# Patient Record
Sex: Male | Born: 1990 | Race: White | Hispanic: Yes | Marital: Married | Smoking: Current some day smoker
Health system: Southern US, Community
[De-identification: ages and names within clinical notes are randomized; demographics above are authoritative.]

---
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
## 2008-11-02 ENCOUNTER — Ambulatory Visit: Payer: Self-pay | Admitting: Internal Medicine

## 2008-11-14 ENCOUNTER — Ambulatory Visit: Payer: Self-pay | Admitting: *Deleted

## 2008-11-21 ENCOUNTER — Emergency Department (HOSPITAL_COMMUNITY): Admission: EM | Admit: 2008-11-21 | Discharge: 2008-11-21 | Payer: Self-pay | Admitting: Emergency Medicine

## 2008-11-30 ENCOUNTER — Ambulatory Visit: Payer: Self-pay | Admitting: Internal Medicine

## 2008-12-19 ENCOUNTER — Ambulatory Visit: Payer: Self-pay | Admitting: Internal Medicine

## 2009-03-15 ENCOUNTER — Ambulatory Visit (HOSPITAL_COMMUNITY): Admission: RE | Admit: 2009-03-15 | Discharge: 2009-03-15 | Payer: Self-pay | Admitting: Gastroenterology

## 2009-03-29 ENCOUNTER — Ambulatory Visit: Payer: Self-pay | Admitting: Internal Medicine

## 2009-04-09 ENCOUNTER — Ambulatory Visit (HOSPITAL_COMMUNITY): Admission: RE | Admit: 2009-04-09 | Discharge: 2009-04-09 | Payer: Self-pay | Admitting: Family Medicine

## 2009-05-15 ENCOUNTER — Ambulatory Visit: Payer: Self-pay | Admitting: Internal Medicine

## 2009-05-15 ENCOUNTER — Encounter (INDEPENDENT_AMBULATORY_CARE_PROVIDER_SITE_OTHER): Payer: Self-pay | Admitting: Adult Health

## 2009-05-15 LAB — CONVERTED CEMR LAB
Chlamydia, Swab/Urine, PCR: NEGATIVE
GC Probe Amp, Urine: NEGATIVE

## 2009-09-17 ENCOUNTER — Emergency Department (HOSPITAL_COMMUNITY): Admission: EM | Admit: 2009-09-17 | Discharge: 2009-09-17 | Payer: Self-pay | Admitting: Emergency Medicine

## 2010-08-13 LAB — CLOTEST (H. PYLORI), BIOPSY: Helicobacter screen: NEGATIVE

## 2010-08-18 LAB — COMPREHENSIVE METABOLIC PANEL
ALT: 9 U/L (ref 0–53)
AST: 19 U/L (ref 0–37)
Albumin: 4.3 g/dL (ref 3.5–5.2)
Alkaline Phosphatase: 67 U/L (ref 39–117)
BUN: 12 mg/dL (ref 6–23)
CO2: 24 mEq/L (ref 19–32)
Calcium: 9.5 mg/dL (ref 8.4–10.5)
Chloride: 105 mEq/L (ref 96–112)
Creatinine, Ser: 0.79 mg/dL (ref 0.4–1.5)
GFR calc Af Amer: >60 mL/min (ref >60)
GFR calc non Af Amer: >60 mL/min (ref >60)
Glucose, Bld: 96 mg/dL (ref 70–99)
Potassium: 4.1 mEq/L (ref 3.5–5.1)
Sodium: 137 mEq/L (ref 135–145)
Total Bilirubin: 1.3 mg/dL — ABNORMAL HIGH (ref 0.3–1.2)
Total Protein: 7 g/dL (ref 6.0–8.3)

## 2010-08-18 LAB — CBC
HCT: 37.8 % — ABNORMAL LOW (ref 39.0–52.0)
Hemoglobin: 13 g/dL (ref 13.0–17.0)
MCHC: 34.4 g/dL (ref 30.0–36.0)
MCV: 90.1 fL (ref 78.0–100.0)
Platelets: 271 10*3/uL (ref 150–400)
RBC: 4.19 MIL/uL — ABNORMAL LOW (ref 4.22–5.81)
RDW: 12.7 % (ref 11.5–15.5)
WBC: 6.5 10*3/uL (ref 4.0–10.5)

## 2010-08-18 LAB — URINALYSIS, ROUTINE W REFLEX MICROSCOPIC
Bilirubin Urine: NEGATIVE
Glucose, UA: NEGATIVE mg/dL
Hgb urine dipstick: NEGATIVE
Ketones, ur: NEGATIVE mg/dL
Nitrite: NEGATIVE
Protein, ur: NEGATIVE mg/dL
Specific Gravity, Urine: 1.028 (ref 1.005–1.030)
Urobilinogen, UA: 1 mg/dL (ref 0.0–1.0)
pH: 6 (ref 5.0–8.0)

## 2010-08-18 LAB — DIFFERENTIAL
Basophils Absolute: 0 10*3/uL (ref 0.0–0.1)
Basophils Relative: 1 % (ref 0–1)
Eosinophils Absolute: 0.1 10*3/uL (ref 0.0–0.7)
Eosinophils Relative: 1 % (ref 0–5)
Lymphocytes Relative: 34 % (ref 12–46)
Lymphs Abs: 2.2 10*3/uL (ref 0.7–4.0)
Monocytes Absolute: 0.5 10*3/uL (ref 0.1–1.0)
Monocytes Relative: 8 % (ref 3–12)
Neutro Abs: 3.6 10*3/uL (ref 1.7–7.7)
Neutrophils Relative %: 56 % (ref 43–77)

## 2010-11-17 ENCOUNTER — Emergency Department (HOSPITAL_COMMUNITY): Payer: Self-pay

## 2010-11-17 ENCOUNTER — Emergency Department (HOSPITAL_COMMUNITY)
Admission: EM | Admit: 2010-11-17 | Discharge: 2010-11-17 | Disposition: A | Discharge status: Home / Self Care | Payer: Self-pay | Attending: Emergency Medicine | Admitting: Emergency Medicine

## 2010-11-17 DIAGNOSIS — W19XXXA Unspecified fall, initial encounter: Secondary | ICD-10-CM | POA: Insufficient documentation

## 2010-11-17 DIAGNOSIS — M25539 Pain in unspecified wrist: Secondary | ICD-10-CM | POA: Insufficient documentation

## 2010-11-17 DIAGNOSIS — S63509A Unspecified sprain of unspecified wrist, initial encounter: Secondary | ICD-10-CM | POA: Insufficient documentation

## 2016-05-25 ENCOUNTER — Ambulatory Visit (INDEPENDENT_AMBULATORY_CARE_PROVIDER_SITE_OTHER): Payer: BLUE CROSS/BLUE SHIELD | Admitting: Emergency Medicine

## 2016-05-25 VITALS — BP 98/70 | HR 103 | Temp 98.6°F | Resp 18 | Ht 64.0 in | Wt 122.0 lb

## 2016-05-25 DIAGNOSIS — M79605 Pain in left leg: Secondary | ICD-10-CM | POA: Insufficient documentation

## 2016-05-25 DIAGNOSIS — F411 Generalized anxiety disorder: Secondary | ICD-10-CM | POA: Diagnosis not present

## 2016-05-25 MED ORDER — FLUOXETINE HCL 20 MG PO TABS: 20.0000 mg | ORAL_TABLET | Freq: Every day | ORAL | 3 refills | Status: DC

## 2016-05-25 NOTE — Patient Instructions (Addendum)
   IF you received an x-ray today, you will receive an invoice from [facility]. Please contact [facility] at [phone] with questions or concerns regarding your invoice.   IF you received labwork today, you will receive an invoice from [facility]. Please contact [facility] at [phone] with questions or concerns regarding your invoice.   Our billing staff will not be able to assist you with questions regarding bills from these companies.  You will be contacted with the lab results as soon as they are available. The fastest way to get your results is to activate your My Chart account. Instructions are located on the last page of this paperwork. If you have not heard from us regarding the results in 2 weeks, please contact this office.     Generalized Anxiety Disorder Generalized anxiety disorder (GAD) is a mental disorder. It interferes with life functions, including relationships, work, and school. GAD is different from normal anxiety, which everyone experiences at some point in their lives in response to specific life events and activities. Normal anxiety actually helps us prepare for and get through these life events and activities. Normal anxiety goes away after the event or activity is over.  GAD causes anxiety that is not necessarily related to specific events or activities. It also causes excess anxiety in proportion to specific events or activities. The anxiety associated with GAD is also difficult to control. GAD can vary from mild to severe. People with severe GAD can have intense waves of anxiety with physical symptoms (panic attacks).  SYMPTOMS The anxiety and worry associated with GAD are difficult to control. This anxiety and worry are related to many life events and activities and also occur more days than not for 6 months or longer. People with GAD also have three or more of the following symptoms (one or more in children):  Restlessness.    Fatigue.  Difficulty concentrating.   Irritability.  Muscle tension.  Difficulty sleeping or unsatisfying sleep. DIAGNOSIS GAD is diagnosed through an assessment by your health care provider. Your health care provider will ask you questions aboutyour mood,physical symptoms, and events in your life. Your health care provider may ask you about your medical history and use of alcohol or drugs, including prescription medicines. Your health care provider may also do a physical exam and blood tests. Certain medical conditions and the use of certain substances can cause symptoms similar to those associated with GAD. Your health care provider may refer you to a mental health specialist for further evaluation. TREATMENT The following therapies are usually used to treat GAD:   Medication. Antidepressant medication usually is prescribed for long-term daily control. Antianxiety medicines may be added in severe cases, especially when panic attacks occur.   Talk therapy (psychotherapy). Certain types of talk therapy can be helpful in treating GAD by providing support, education, and guidance. A form of talk therapy called cognitive behavioral therapy can teach you healthy ways to think about and react to daily life events and activities.  Stress managementtechniques. These include yoga, meditation, and exercise and can be very helpful when they are practiced regularly. A mental health specialist can help determine which treatment is best for you. Some people see improvement with one therapy. However, other people require a combination of therapies. This information is not intended to replace advice given to you by your health care provider. Make sure you discuss any questions you have with your health care provider. Document Released: 08/22/2012 Document Revised: 05/18/2014 Document Reviewed: 08/22/2012 Elsevier   Interactive Patient Education  2017 Elsevier Inc.  

## 2016-05-25 NOTE — Progress Notes (Signed)
Joshua MeadowFrancis S Morris 25 y.o.   Chief Complaint  Patient presents with  . Leg Pain    L SIDE  . Stress  . Anxiety    HISTORY OF PRESENT ILLNESS: This is a 26 y.o. male complaining of chronic left leg pain x at least 4-5 years; no new trauma or symptoms; also c/o general anxiety/stress interfering with his normal regular activities.  HPI   Prior to Admission medications   Medication Sig Start Date End Date Taking? Authorizing Provider  FLUoxetine (PROZAC) 20 MG tablet Take 1 tablet (20 mg total) by mouth daily. 05/25/16   Dakiyah Heinke Victorino DecemberJose Ardella Chhim, MD    No Known Allergies  There are no active problems to display for this patient.   History reviewed. No pertinent past medical history.  History reviewed. No pertinent surgical history.  Social History   Social History  . Marital status: Single    Spouse name: N/A  . Number of children: N/A  . Years of education: N/A   Occupational History  . Not on file.   Social History Main Topics  . Smoking status: Never Smoker  . Smokeless tobacco: Never Used  . Alcohol use Not on file  . Drug use: Unknown  . Sexual activity: Not on file   Other Topics Concern  . Not on file   Social History Narrative  . No narrative on file    History reviewed. No pertinent family history.   Review of Systems  Constitutional: Negative for chills, fever, malaise/fatigue and weight loss.  HENT: Negative for congestion, ear pain, hearing loss, nosebleeds and sore throat.   Eyes: Negative for blurred vision, double vision and pain.  Respiratory: Negative for cough, hemoptysis, shortness of breath and wheezing.   Cardiovascular: Negative for chest pain, palpitations and leg swelling.  Gastrointestinal: Negative for abdominal pain, diarrhea, melena, nausea and vomiting.  Genitourinary: Negative for dysuria, flank pain and hematuria.  Musculoskeletal: Negative for back pain, myalgias and neck pain.       Left leg pain/cramps.  Skin: Negative for  rash.  Neurological: Negative for dizziness, tingling, sensory change, focal weakness and headaches.  Endo/Heme/Allergies: Does not bruise/bleed easily.  Psychiatric/Behavioral: Negative for depression, hallucinations, memory loss, substance abuse and suicidal ideas. The patient is nervous/anxious and has insomnia.   All other systems reviewed and are negative.  Vitals:   05/25/16 1425  BP: 98/70  Pulse: (!) 103  Resp: 18  Temp: 98.6 F (37 C)    Physical Exam  Constitutional: He is oriented to person, place, and time. He appears well-developed and well-nourished.  HENT:  Head: Normocephalic and atraumatic.  Right Ear: External ear normal.  Left Ear: External ear normal.  Nose: Nose normal.  Mouth/Throat: Oropharynx is clear and moist.  Eyes: Conjunctivae and EOM are normal. Pupils are equal, round, and reactive to light.  Neck: Normal range of motion. Neck supple. No thyromegaly present.  Cardiovascular: Normal rate, regular rhythm, normal heart sounds and intact distal pulses.   Pulmonary/Chest: Effort normal and breath sounds normal. No respiratory distress. He has no wheezes. He has no rales.  Abdominal: Soft. Bowel sounds are normal. He exhibits no distension. There is no tenderness.  Musculoskeletal: Normal range of motion.  LE : NVI, FROM, WNL  Lymphadenopathy:    He has no cervical adenopathy.  Neurological: He is alert and oriented to person, place, and time. He displays normal reflexes. No sensory deficit. He exhibits normal muscle tone. Coordination normal.  Skin: Skin is warm and  dry.  Psychiatric: He has a normal mood and affect. His behavior is normal.  Nursing note and vitals reviewed.    ASSESSMENT & PLAN: Joshua Morris was seen today for leg pain, stress and anxiety.  Diagnoses and all orders for this visit:  GAD (generalized anxiety disorder)  Left leg pain  Other orders -     FLUoxetine (PROZAC) 20 MG tablet; Take 1 tablet (20 mg total) by mouth  daily.  chronic left leg pain  Patient Instructions       IF you received an x-ray today, you will receive an invoice from [facility]. Please contact [facility] at [phone] with questions or concerns regarding your invoice.   IF you received labwork today, you will receive an invoice from [facility]. Please contact [facility] at [phone] with questions or concerns regarding your invoice.   Our billing staff will not be able to assist you with questions regarding bills from these companies.  You will be contacted with the lab results as soon as they are available. The fastest way to get your results is to activate your My Chart account. Instructions are located on the last page of this paperwork. If you have not heard from [place] regarding the results in 2 weeks, please contact this office.     Generalized Anxiety Disorder Generalized anxiety disorder (GAD) is a mental disorder. It interferes with life functions, including relationships, work, and school. GAD is different from normal anxiety, which everyone experiences at some point in their lives in response to specific life events and activities. Normal anxiety actually helps [place] prepare for and get through these life events and activities. Normal anxiety goes away after the event or activity is over.  GAD causes anxiety that is not necessarily related to specific events or activities. It also causes excess anxiety in proportion to specific events or activities. The anxiety associated with GAD is also difficult to control. GAD can vary from mild to severe. People with severe GAD can have intense waves of anxiety with physical symptoms (panic attacks).  SYMPTOMS The anxiety and worry associated with GAD are difficult to control. This anxiety and worry are related to many life events and activities and also occur more days than not for 6 months or longer. People with GAD also have three or more of the following symptoms  (one or more in children):  Restlessness.   Fatigue.  Difficulty concentrating.   Irritability.  Muscle tension.  Difficulty sleeping or unsatisfying sleep. DIAGNOSIS GAD is diagnosed through an assessment by your health care provider. Your health care provider will ask you questions aboutyour mood,physical symptoms, and events in your life. Your health care provider may ask you about your medical history and use of alcohol or drugs, including prescription medicines. Your health care provider may also do a physical exam and blood tests. Certain medical conditions and the use of certain substances can cause symptoms similar to those associated with GAD. Your health care provider may refer you to a mental health specialist for further evaluation. TREATMENT The following therapies are usually used to treat GAD:   Medication. Antidepressant medication usually is prescribed for long-term daily control. Antianxiety medicines may be added in severe cases, especially when panic attacks occur.   Talk therapy (psychotherapy). Certain types of talk therapy can be helpful in treating GAD by providing support, education, and guidance. A form of talk therapy called cognitive behavioral therapy can teach you healthy ways to think about and react to daily life events and  activities.  Stress managementtechniques. These include yoga, meditation, and exercise and can be very helpful when they are practiced regularly. A mental health specialist can help determine which treatment is best for you. Some people see improvement with one therapy. However, other people require a combination of therapies. This information is not intended to replace advice given to you by your health care provider. Make sure you discuss any questions you have with your health care provider. Document Released: 08/22/2012 Document Revised: 05/18/2014 Document Reviewed: 08/22/2012 Elsevier Interactive Patient Education  2017  Elsevier Inc.      Edwina Barth, MD [facility]

## 2016-07-29 ENCOUNTER — Telehealth: Payer: Self-pay | Admitting: Emergency Medicine

## 2016-07-29 MED ORDER — FLUOXETINE HCL 20 MG PO TABS: 20.0000 mg | ORAL_TABLET | Freq: Every day | ORAL | 0 refills | Status: DC

## 2016-07-29 NOTE — Telephone Encounter (Signed)
Pt is needing a refill on floxatine and the new insurance is requiring a 90 day supply   Best number to pharmacy is [phone]  Fax [phone]

## 2016-07-31 ENCOUNTER — Other Ambulatory Visit: Payer: Self-pay

## 2016-07-31 MED ORDER — FLUOXETINE HCL 20 MG PO TABS: 20.0000 mg | ORAL_TABLET | Freq: Every day | ORAL | 0 refills | Status: DC

## 2016-07-31 NOTE — Telephone Encounter (Signed)
[facility]ax req [facility] - states pt changing pharmacy from [facility] [address] to [facility].  [facility], they did not fill - sent out to [facility].  [facility] - RX transferred.  Put in pts MAR

## 2017-03-13 ENCOUNTER — Encounter (HOSPITAL_COMMUNITY): Payer: Self-pay | Admitting: Emergency Medicine

## 2017-03-13 ENCOUNTER — Emergency Department (HOSPITAL_COMMUNITY)
Admission: EM | Admit: 2017-03-13 | Discharge: 2017-03-13 | Disposition: A | Discharge status: Home / Self Care | Payer: BLUE CROSS/BLUE SHIELD | Attending: Emergency Medicine | Admitting: Emergency Medicine

## 2017-03-13 DIAGNOSIS — Y998 Other external cause status: Secondary | ICD-10-CM | POA: Diagnosis not present

## 2017-03-13 DIAGNOSIS — W51XXXA Accidental striking against or bumped into by another person, initial encounter: Secondary | ICD-10-CM | POA: Insufficient documentation

## 2017-03-13 DIAGNOSIS — Y939 Activity, unspecified: Secondary | ICD-10-CM | POA: Diagnosis not present

## 2017-03-13 DIAGNOSIS — Y929 Unspecified place or not applicable: Secondary | ICD-10-CM | POA: Diagnosis not present

## 2017-03-13 DIAGNOSIS — Z23 Encounter for immunization: Secondary | ICD-10-CM | POA: Insufficient documentation

## 2017-03-13 DIAGNOSIS — S01112A Laceration without foreign body of left eyelid and periocular area, initial encounter: Secondary | ICD-10-CM | POA: Insufficient documentation

## 2017-03-13 DIAGNOSIS — S0181XA Laceration without foreign body of other part of head, initial encounter: Secondary | ICD-10-CM

## 2017-03-13 MED ORDER — TETANUS-DIPHTH-ACELL PERTUSSIS 5-2.5-18.5 LF-MCG/0.5 IM SUSP
0.5000 mL | Freq: Once | INTRAMUSCULAR | Status: AC
  Administered 2017-03-13: 0.5 mL via INTRAMUSCULAR
  Filled 2017-03-13: qty 0.5

## 2017-03-13 NOTE — ED Triage Notes (Signed)
Patient complaining of head laceration over left eyebrower. Patient sister accidentally scratched him with her fingernails around 12am. Patient states he can not work with stitches.

## 2017-03-13 NOTE — ED Provider Notes (Signed)
[facility]-EMERGENCY DEPT Provider Note   CSN: [id number] Arrival date & time: 03/13/17  0050     History   Chief Complaint Chief Complaint  Patient presents with  . Facial Laceration    HPI Joshua Morris is a 26 y.o. male here with laceration. Patient had an argument with his sister earlier in the day and she scratched his left eyebrow with her finger nail. He states that there is some bleeding but it stopped. Unknown tetanus. Otherwise healthy.   The history is provided by the patient.    History reviewed. No pertinent past medical history.  Patient Active Problem List   Diagnosis Date Noted  . GAD (generalized anxiety disorder) 05/25/2016  . Left leg pain 05/25/2016    History reviewed. No pertinent surgical history.     Home Medications    Prior to Admission medications   Medication Sig Start Date End Date Taking? Authorizing Provider  FLUoxetine (PROZAC) 20 MG tablet Take 1 tablet (20 mg total) by mouth daily. 07/31/16   Georgina Quint, MD    Family History History reviewed. No pertinent family history.  Social History Social History  Substance Use Topics  . Smoking status: Never Smoker  . Smokeless tobacco: Never Used  . Alcohol use No     Allergies   Patient has no known allergies.   Review of Systems Review of Systems  Skin: Positive for wound.  All other systems reviewed and are negative.    Physical Exam Updated Vital Signs BP (!) 135/96 (BP Location: Right Arm)   Pulse 95   Temp 98 F (36.7 C) (Oral)   Resp 18   Ht 5\' 4"  (1.626 m)   Wt 56.7 kg (125 lb)   SpO2 100%   BMI 21.46 kg/m   Physical Exam  Constitutional: He appears well-developed and well-nourished.  HENT:  Head: Normocephalic.  Mouth/Throat: Oropharynx is clear and moist.  Eyes: Pupils are equal, round, and reactive to light.  2 cm laceration above L eyebrow. Well approximated   Neck: Normal range of motion. Neck supple.   Cardiovascular: Normal rate, regular rhythm and normal heart sounds.   Pulmonary/Chest: Effort normal.  Abdominal: Soft.  Musculoskeletal: Normal range of motion.  Neurological: He is alert.  Skin: Skin is warm.  Psychiatric: He has a normal mood and affect.  Nursing note and vitals reviewed.    ED Treatments / Results  Labs (all labs ordered are listed, but only abnormal results are displayed) Labs Reviewed - No data to display  EKG  EKG Interpretation None       Radiology No results found.  Procedures Procedures (including critical care time)  LACERATION REPAIR Performed by: Richardean Canal Authorized by: Richardean Canal Consent: Verbal consent obtained. Risks and benefits: risks, benefits and alternatives were discussed Consent given by: patient Patient identity confirmed: provided demographic data Prepped and Draped in normal sterile fashion Wound explored  Laceration Location: L eyebrow  Laceration Length: 2 cm  No Foreign Bodies seen or palpated  Anesthesia: none   Irrigation method: syringe Amount of cleaning: standard  Skin closure: dermabond    Patient tolerance: Patient tolerated the procedure well with no immediate complications.   Medications Ordered in ED Medications  Tdap (BOOSTRIX) injection 0.5 mL (not administered)     Initial Impression / Assessment and Plan / ED Course  I have reviewed the triage vital signs and the nursing notes.  Pertinent labs & imaging results that were available  during my care of the patient were reviewed by me and considered in my medical decision making (see chart for details).     Joshua Morris is a 26 y.o. male here with laceration above L eyebrow. Laceration well approximated. Wound cleaned, applied dermabond. Gave strict return precautions.    Final Clinical Impressions(s) / ED Diagnoses   Final diagnoses:  None    New Prescriptions New Prescriptions   No medications on file     Charlynne PanderYao, Micholas Drumwright  Hsienta, MD 03/13/17 0400

## 2017-03-13 NOTE — Discharge Instructions (Signed)
Keep the area dry for 24 hrs then you may get it wet.   The glue will fall off in several days. You may have a small scar.   See your doctor  Return to ER if you have fever, purulent drainage from wound, redness around the wound, severe pain.

## 2018-12-12 DIAGNOSIS — Z23 Encounter for immunization: Secondary | ICD-10-CM | POA: Diagnosis not present

## 2019-03-15 ENCOUNTER — Other Ambulatory Visit: Payer: Self-pay

## 2019-03-15 DIAGNOSIS — Z20822 Contact with and (suspected) exposure to covid-19: Secondary | ICD-10-CM

## 2019-03-16 LAB — NOVEL CORONAVIRUS, NAA: SARS-CoV-2, NAA: NOT DETECTED

## 2020-09-03 DIAGNOSIS — F411 Generalized anxiety disorder: Secondary | ICD-10-CM | POA: Diagnosis not present

## 2020-09-03 DIAGNOSIS — F32A Depression, unspecified: Secondary | ICD-10-CM | POA: Diagnosis not present

## 2020-09-03 DIAGNOSIS — M79662 Pain in left lower leg: Secondary | ICD-10-CM | POA: Diagnosis not present

## 2020-09-03 DIAGNOSIS — K2970 Gastritis, unspecified, without bleeding: Secondary | ICD-10-CM | POA: Diagnosis not present

## 2020-09-04 ENCOUNTER — Other Ambulatory Visit: Payer: Self-pay | Admitting: Internal Medicine

## 2020-09-04 DIAGNOSIS — I824Y9 Acute embolism and thrombosis of unspecified deep veins of unspecified proximal lower extremity: Secondary | ICD-10-CM

## 2020-09-05 ENCOUNTER — Ambulatory Visit
Admission: RE | Admit: 2020-09-05 | Discharge: 2020-09-05 | Disposition: A | Discharge status: Home / Self Care | Payer: BLUE CROSS/BLUE SHIELD | Source: Ambulatory Visit | Attending: Internal Medicine | Admitting: Internal Medicine

## 2020-09-05 DIAGNOSIS — M7989 Other specified soft tissue disorders: Secondary | ICD-10-CM | POA: Diagnosis not present

## 2020-09-05 DIAGNOSIS — I824Y9 Acute embolism and thrombosis of unspecified deep veins of unspecified proximal lower extremity: Secondary | ICD-10-CM

## 2020-09-30 DIAGNOSIS — F32A Depression, unspecified: Secondary | ICD-10-CM | POA: Diagnosis not present

## 2020-09-30 DIAGNOSIS — R030 Elevated blood-pressure reading, without diagnosis of hypertension: Secondary | ICD-10-CM | POA: Diagnosis not present

## 2020-09-30 DIAGNOSIS — F411 Generalized anxiety disorder: Secondary | ICD-10-CM | POA: Diagnosis not present

## 2020-09-30 DIAGNOSIS — K2970 Gastritis, unspecified, without bleeding: Secondary | ICD-10-CM | POA: Diagnosis not present

## 2020-11-15 DIAGNOSIS — K2970 Gastritis, unspecified, without bleeding: Secondary | ICD-10-CM | POA: Diagnosis not present

## 2020-11-15 DIAGNOSIS — Z0001 Encounter for general adult medical examination with abnormal findings: Secondary | ICD-10-CM | POA: Diagnosis not present

## 2020-11-15 DIAGNOSIS — R030 Elevated blood-pressure reading, without diagnosis of hypertension: Secondary | ICD-10-CM | POA: Diagnosis not present

## 2020-11-15 DIAGNOSIS — F32A Depression, unspecified: Secondary | ICD-10-CM | POA: Diagnosis not present

## 2021-05-04 IMAGING — US US EXTREM LOW VENOUS
1 series · 13 of 24 positions shown · non-contrast
Comparison: None.

CLINICAL DATA: Rule out DVT.  Bilateral calf/feet swelling.



[Series 1: us extrem low venous · 0.07mm/px · 13 of 74 slices shown]
[im 1/74]
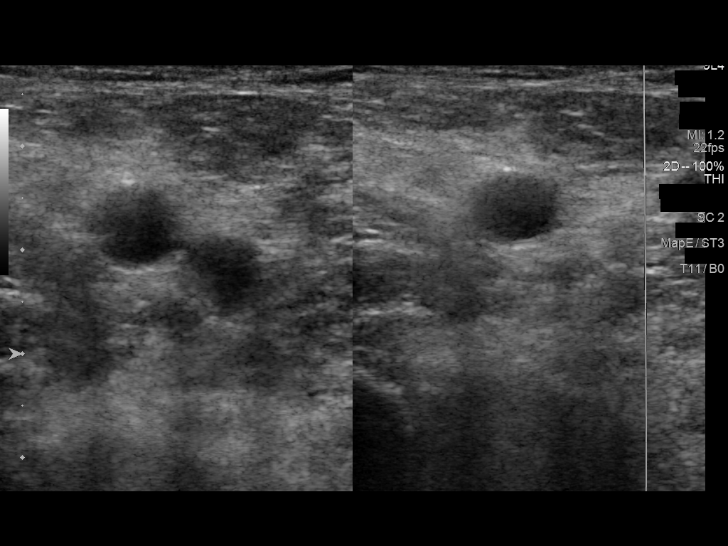
[im 7/74]
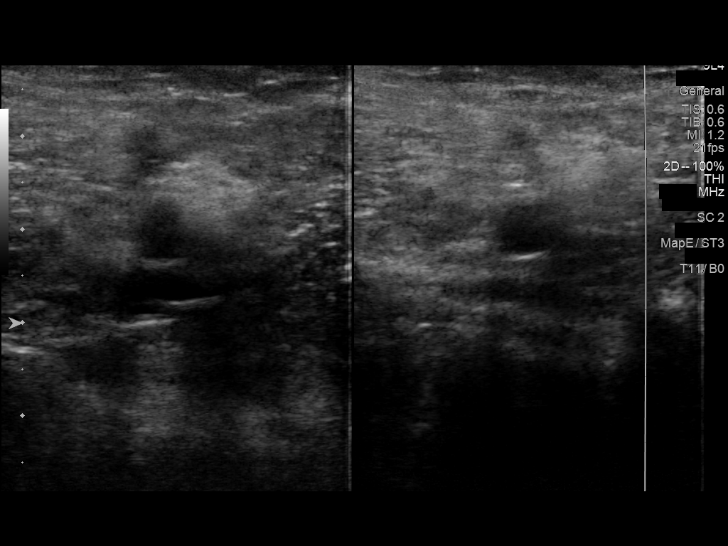
[im 13/74]
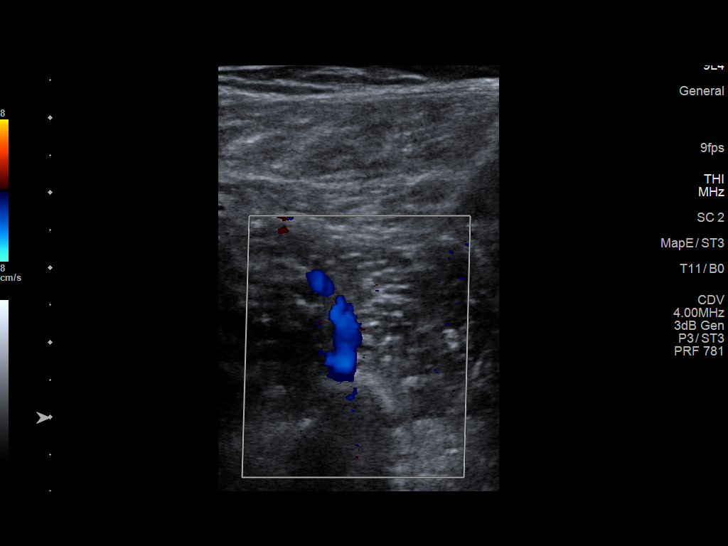
[im 20/74]
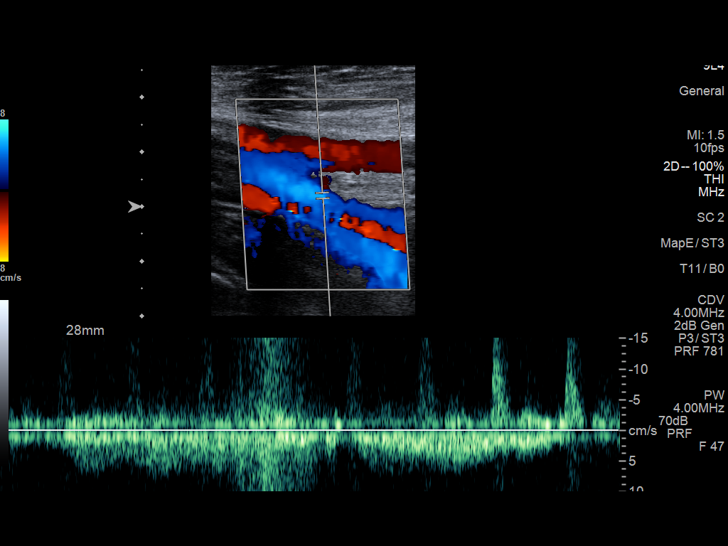
[im 26/74]
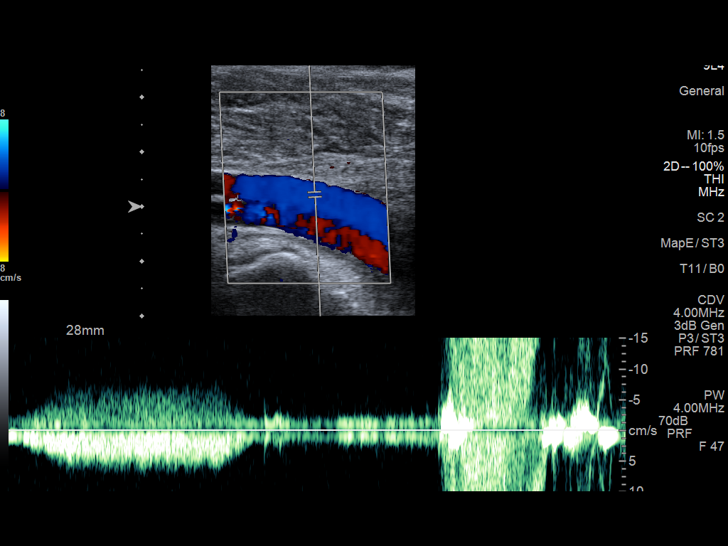
[im 32/74]
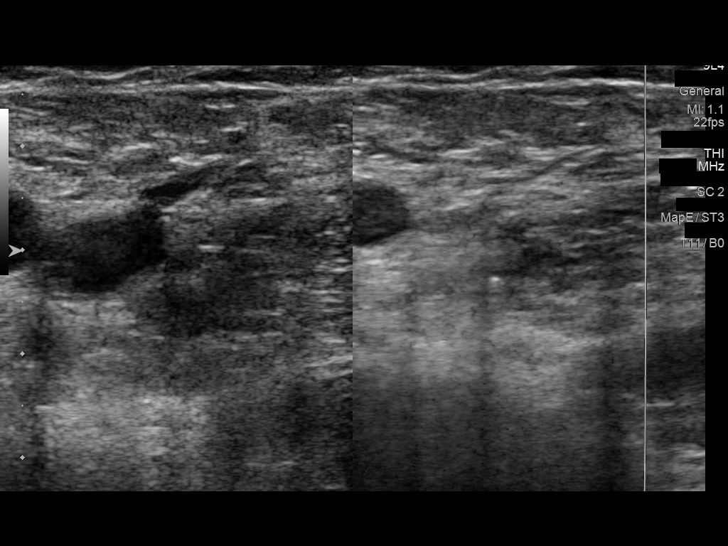
[im 39/74]
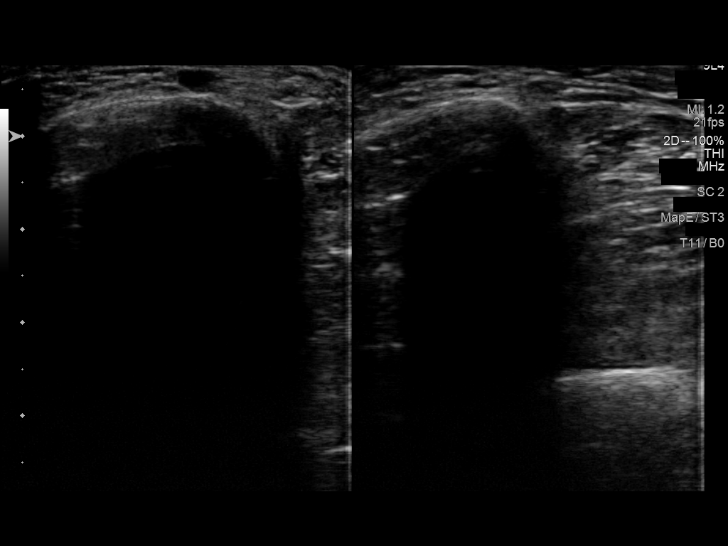
[im 42/74]
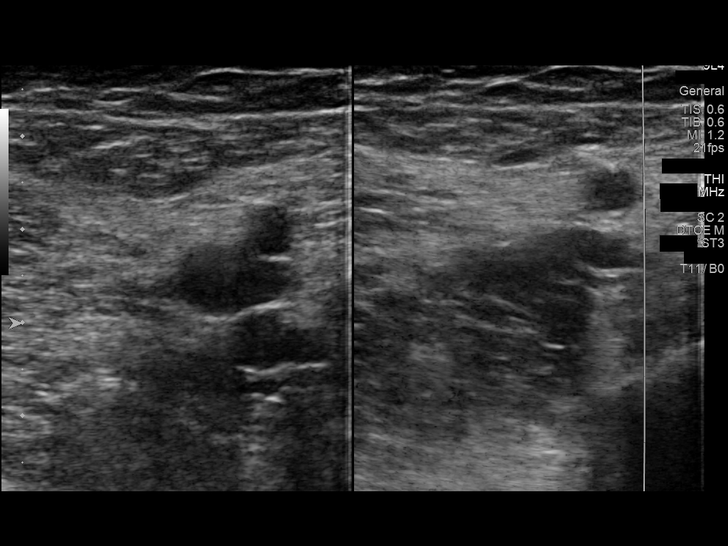
[im 48/74]
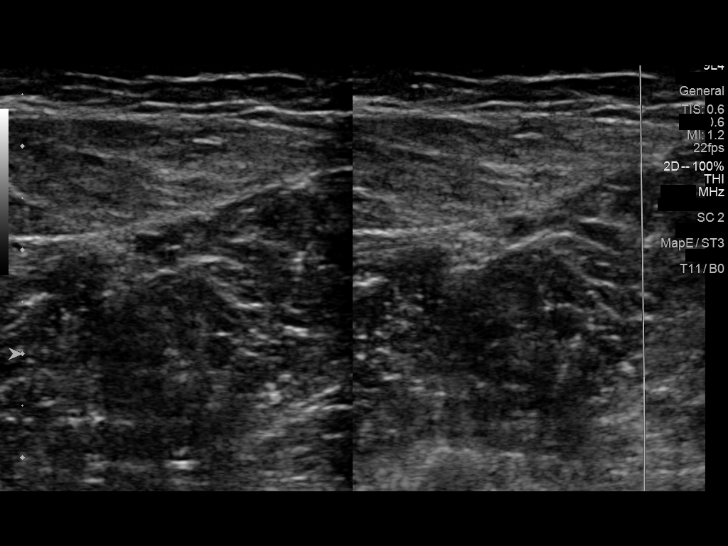
[im 54/74]
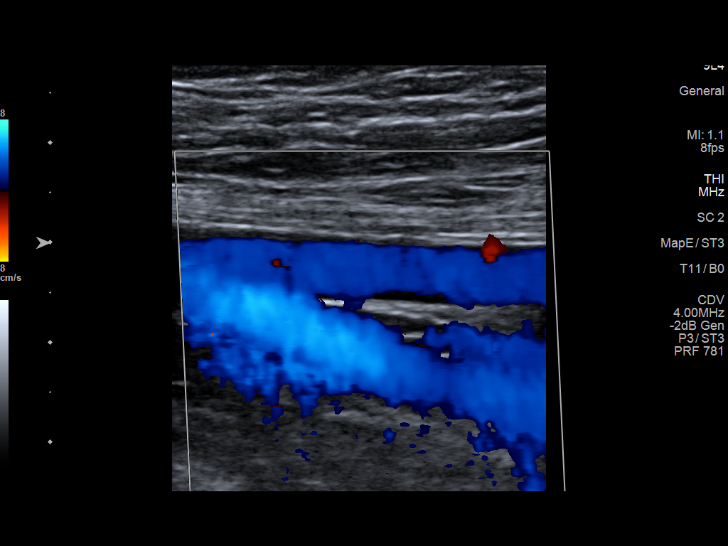
[im 61/74]
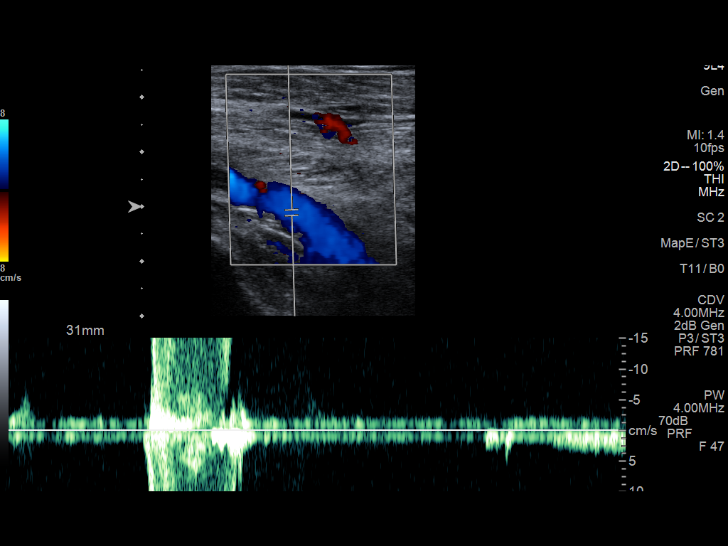
[im 67/74]
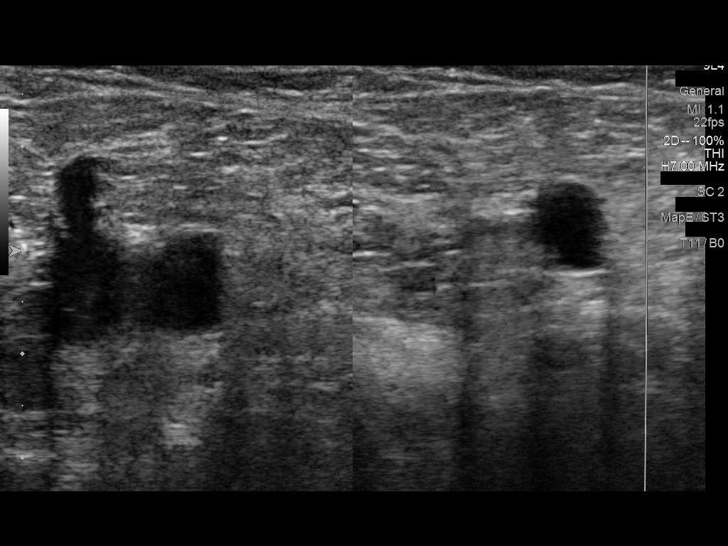
[im 74/74]
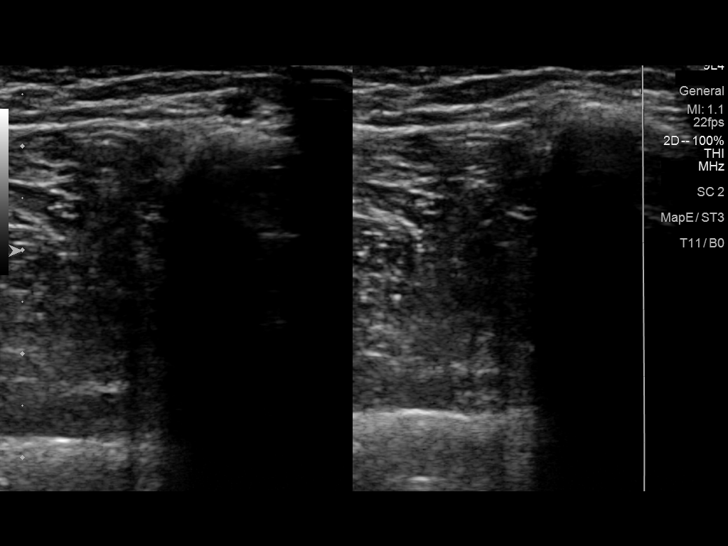

[13 of 24 positions shown; findings below may reference images not displayed]

FINDINGS: RIGHT LOWER EXTREMITY

Common Femoral Vein: No evidence of thrombus. Normal
compressibility, respiratory phasicity and response to augmentation.

Saphenofemoral Junction: No evidence of thrombus. Normal
compressibility and flow on color Doppler imaging.

Profunda Femoral Vein: No evidence of thrombus. Normal
compressibility and flow on color Doppler imaging.

Femoral Vein: No evidence of thrombus. Normal compressibility,
respiratory phasicity and response to augmentation.

Popliteal Vein: No evidence of thrombus. Normal compressibility,
respiratory phasicity and response to augmentation.

Calf Veins: No evidence of thrombus. Normal compressibility and flow
on color Doppler imaging.

Superficial Great Saphenous Vein: No evidence of thrombus. Normal
compressibility.

Venous Reflux:  None.

LEFT LOWER EXTREMITY

Common Femoral Vein: No evidence of thrombus. Normal
compressibility, respiratory phasicity and response to augmentation.

Saphenofemoral Junction: No evidence of thrombus. Normal
compressibility and flow on color Doppler imaging.

Profunda Femoral Vein: No evidence of thrombus. Normal
compressibility and flow on color Doppler imaging.

Femoral Vein: No evidence of thrombus. Normal compressibility,
respiratory phasicity and response to augmentation.

Popliteal Vein: No evidence of thrombus. Normal compressibility,
respiratory phasicity and response to augmentation.

Calf Veins: No evidence of thrombus. Normal compressibility and flow
on color Doppler imaging.

Superficial Great Saphenous Vein: No evidence of thrombus. Normal
compressibility.

Venous Reflux:  None.
IMPRESSION: No evidence of deep venous thrombosis in either lower extremity.

## 2021-09-30 DIAGNOSIS — R195 Other fecal abnormalities: Secondary | ICD-10-CM | POA: Diagnosis not present

## 2021-09-30 DIAGNOSIS — K219 Gastro-esophageal reflux disease without esophagitis: Secondary | ICD-10-CM | POA: Diagnosis not present

## 2021-09-30 DIAGNOSIS — R8290 Unspecified abnormal findings in urine: Secondary | ICD-10-CM | POA: Diagnosis not present

## 2021-09-30 DIAGNOSIS — M545 Low back pain, unspecified: Secondary | ICD-10-CM | POA: Diagnosis not present

## 2021-09-30 DIAGNOSIS — N50819 Testicular pain, unspecified: Secondary | ICD-10-CM | POA: Diagnosis not present

## 2021-10-21 DIAGNOSIS — R195 Other fecal abnormalities: Secondary | ICD-10-CM | POA: Diagnosis not present

## 2021-10-21 DIAGNOSIS — Z131 Encounter for screening for diabetes mellitus: Secondary | ICD-10-CM | POA: Diagnosis not present

## 2021-10-21 DIAGNOSIS — N50819 Testicular pain, unspecified: Secondary | ICD-10-CM | POA: Diagnosis not present

## 2021-10-21 DIAGNOSIS — M722 Plantar fascial fibromatosis: Secondary | ICD-10-CM | POA: Diagnosis not present

## 2021-10-21 DIAGNOSIS — Z136 Encounter for screening for cardiovascular disorders: Secondary | ICD-10-CM | POA: Diagnosis not present

## 2021-10-21 DIAGNOSIS — Z23 Encounter for immunization: Secondary | ICD-10-CM | POA: Diagnosis not present

## 2021-10-21 DIAGNOSIS — Z0001 Encounter for general adult medical examination with abnormal findings: Secondary | ICD-10-CM | POA: Diagnosis not present

## 2021-10-21 DIAGNOSIS — K219 Gastro-esophageal reflux disease without esophagitis: Secondary | ICD-10-CM | POA: Diagnosis not present

## 2021-10-23 ENCOUNTER — Other Ambulatory Visit: Payer: Self-pay | Admitting: Family Medicine

## 2021-10-23 DIAGNOSIS — N50811 Right testicular pain: Secondary | ICD-10-CM

## 2021-11-04 ENCOUNTER — Ambulatory Visit (INDEPENDENT_AMBULATORY_CARE_PROVIDER_SITE_OTHER): Payer: BC Managed Care – PPO

## 2021-11-04 ENCOUNTER — Ambulatory Visit (INDEPENDENT_AMBULATORY_CARE_PROVIDER_SITE_OTHER): Payer: BC Managed Care – PPO | Admitting: Podiatry

## 2021-11-04 DIAGNOSIS — M722 Plantar fascial fibromatosis: Secondary | ICD-10-CM | POA: Diagnosis not present

## 2021-11-04 DIAGNOSIS — M7750 Other enthesopathy of unspecified foot: Secondary | ICD-10-CM

## 2021-11-04 DIAGNOSIS — M7752 Other enthesopathy of left foot: Secondary | ICD-10-CM | POA: Diagnosis not present

## 2021-11-04 DIAGNOSIS — M7751 Other enthesopathy of right foot: Secondary | ICD-10-CM | POA: Diagnosis not present

## 2021-11-04 MED ORDER — METHYLPREDNISOLONE 4 MG PO TBPK: ORAL_TABLET | ORAL | 0 refills | Status: DC

## 2021-11-04 MED ORDER — MELOXICAM 15 MG PO TABS: 15.0000 mg | ORAL_TABLET | Freq: Every day | ORAL | 3 refills | Status: DC

## 2021-11-06 DIAGNOSIS — R634 Abnormal weight loss: Secondary | ICD-10-CM | POA: Diagnosis not present

## 2021-11-06 DIAGNOSIS — R140 Abdominal distension (gaseous): Secondary | ICD-10-CM | POA: Diagnosis not present

## 2021-11-06 DIAGNOSIS — R194 Change in bowel habit: Secondary | ICD-10-CM | POA: Diagnosis not present

## 2021-11-06 DIAGNOSIS — K219 Gastro-esophageal reflux disease without esophagitis: Secondary | ICD-10-CM | POA: Diagnosis not present

## 2021-11-08 ENCOUNTER — Encounter: Payer: Self-pay | Admitting: Podiatry

## 2021-11-08 NOTE — Progress Notes (Signed)
  Subjective:  Patient ID: Joshua Morris, male    DOB: Sep 12, 1990,  MRN: [id number]  Chief Complaint  Patient presents with   Plantar Fasciitis    Plantar fascitis     31 y.o. male presents with the above complaint. History confirmed with patient.  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 Left Foot: point tenderness of the mid plantar fascia Right Foot: point tenderness of the mid plantar fascia  No images are attached to the encounter.  Radiographs: Multiple views x-ray of both feet: no fracture, dislocation, swelling or degenerative changes noted and there is no discrete plantar calcaneal spur with the calcaneal tuber is quite large, he has a relatively pes cavus foot type Assessment:   1. Plantar fasciitis, bilateral      Plan:  Patient was evaluated and treated and all questions answered.  Discussed the etiology and treatment options for plantar fasciitis including stretching, formal physical therapy, supportive shoegears such as a running shoe or sneaker, pre fabricated orthoses, injection therapy, and oral medications. We also discussed the role of surgical treatment of this for patients who do not improve after exhausting non-surgical treatment options.   -XR reviewed with patient -Educated patient on stretching and icing of the affected limb  -Rx for meloxicam. Educated on use, risks and benefits of the medication -Rx for medrol pack. Educated on use, risks, and benefits of the medication   Return in about 6 weeks (around 12/16/2021) for recheck plantar fasciitis.

## 2021-11-12 DIAGNOSIS — R194 Change in bowel habit: Secondary | ICD-10-CM | POA: Diagnosis not present

## 2021-11-12 DIAGNOSIS — R140 Abdominal distension (gaseous): Secondary | ICD-10-CM | POA: Diagnosis not present

## 2021-11-12 DIAGNOSIS — K219 Gastro-esophageal reflux disease without esophagitis: Secondary | ICD-10-CM | POA: Diagnosis not present

## 2021-11-12 DIAGNOSIS — R634 Abnormal weight loss: Secondary | ICD-10-CM | POA: Diagnosis not present

## 2021-11-25 ENCOUNTER — Encounter: Payer: Self-pay | Admitting: Podiatry

## 2021-11-25 ENCOUNTER — Other Ambulatory Visit: Payer: Self-pay

## 2021-12-16 ENCOUNTER — Ambulatory Visit: Payer: BC Managed Care – PPO | Admitting: Podiatry

## 2022-12-16 ENCOUNTER — Ambulatory Visit
Admission: EM | Admit: 2022-12-16 | Discharge: 2022-12-16 | Disposition: A | Discharge status: Home / Self Care | Payer: Self-pay | Attending: Internal Medicine | Admitting: Internal Medicine

## 2022-12-16 DIAGNOSIS — F1010 Alcohol abuse, uncomplicated: Secondary | ICD-10-CM | POA: Insufficient documentation

## 2022-12-16 DIAGNOSIS — R300 Dysuria: Secondary | ICD-10-CM | POA: Insufficient documentation

## 2022-12-16 DIAGNOSIS — R310 Gross hematuria: Secondary | ICD-10-CM | POA: Insufficient documentation

## 2022-12-16 LAB — POCT URINALYSIS DIP (MANUAL ENTRY)
Bilirubin, UA: NEGATIVE
Glucose, UA: NEGATIVE mg/dL
Ketones, POC UA: NEGATIVE mg/dL
Leukocytes, UA: NEGATIVE
Nitrite, UA: NEGATIVE
Protein Ur, POC: 30 mg/dL — AB
Spec Grav, UA: 1.02 (ref 1.010–1.025)
Urobilinogen, UA: 0.2 E.U./dL
pH, UA: 6 (ref 5.0–8.0)

## 2022-12-16 MED ORDER — TAMSULOSIN HCL 0.4 MG PO CAPS: 0.4000 mg | ORAL_CAPSULE | Freq: Every day | ORAL | 0 refills | Status: DC

## 2022-12-16 NOTE — ED Provider Notes (Signed)
[facility]    CSN: [id number] Arrival date & time: 12/16/22  1800      History   Chief Complaint Chief Complaint  Patient presents with   Hematuria   Dysuria    HPI Joshua Morris is a 32 y.o. male.   Patient presents to urgent care for evaluation of hematuria and dysuria that started yesterday. Symptoms improved slightly yesterday after he had a lot of water to drink but this morning he woke up and urine was "brownish-yellowish". Urine returned to pink/red this afternoon. No pain associated with symptoms initially until 1-2 hours ago when he experienced burning sensation to the suprapubic region and urge to use the restroom to void. He went to void and had another episode of dysuria/hematuria. He is an alcohol drinker and sometimes drinks a 2-3 beers and 1-2 shots of liquor. On the weekends, states he has a little bit more to drink than normal and "drank all day". He had more than 6 beers  and 3 shots of liquor per day over the weekend 3-4 days ago. He has not had any alcohol in the last couple of days since symptoms started. Does not get shaky or have hallucinations after he stops drinking.  Denies family history and personal history of nephrolithiasis.  Denies upper abdominal pain, nausea, vomiting, diarrhea, fever/chills, flank pain, back pain, urinary hesitancy, urinary frequency, and urinary urgency. No dizziness. Has not attempted treatment of symptoms PTA.  No concern for STD.  No penile discharge or recent new sexual partners.   Hematuria  Dysuria Presenting symptoms: dysuria   Associated symptoms: hematuria     History reviewed. No pertinent past medical history.  Patient Active Problem List   Diagnosis Date Noted   GAD (generalized anxiety disorder) 05/25/2016   Left leg pain 05/25/2016    History reviewed. No pertinent surgical history.     Home Medications    Prior to Admission medications   Medication Sig Start Date End Date Taking? Authorizing  Provider  tamsulosin (FLOMAX) 0.4 MG CAPS capsule Take 1 capsule (0.4 mg total) by mouth daily after supper. 12/16/22  Yes Carlisle Beers, FNP  FLUoxetine (PROZAC) 20 MG tablet Take 1 tablet (20 mg total) by mouth daily. 07/31/16   Georgina Quint, MD  meloxicam (MOBIC) 15 MG tablet Take 1 tablet (15 mg total) by mouth daily. 11/11/21   McDonald, Rachelle Hora, DPM  methylPREDNISolone (MEDROL DOSEPAK) 4 MG TBPK tablet 6 day dose pack - take as directed 11/04/21   Edwin Cap, DPM    Family History No family history on file.  Social History Social History   Tobacco Use   Smoking status: Some Days    Types: Cigarettes   Smokeless tobacco: Never  Vaping Use   Vaping status: Some Days  Substance Use Topics   Alcohol use: Yes    Comment: daily   Drug use: No     Allergies   Patient has no known allergies.   Review of Systems Review of Systems  Genitourinary:  Positive for dysuria and hematuria.  Per HPI   Physical Exam Triage Vital Signs ED Triage Vitals  Encounter Vitals Group     BP 12/16/22 1822 (!) 134/94     Systolic BP Percentile --      Diastolic BP Percentile --      Pulse Rate 12/16/22 1822 78     Resp 12/16/22 1822 20     Temp 12/16/22 1822 98.7 F (37.1 C)  Temp Source 12/16/22 1822 Oral     SpO2 12/16/22 1822 100 %     Weight --      Height --      Head Circumference --      Peak Flow --      Pain Score 12/16/22 1821 0     Pain Loc --      Pain Education --      Exclude from Growth Chart --    No data found.  Updated Vital Signs BP (!) 134/94 (BP Location: Right Arm)   Pulse 78   Temp 98.7 F (37.1 C) (Oral)   Resp 20   SpO2 100%   Visual Acuity Right Eye Distance:   Left Eye Distance:   Bilateral Distance:    Right Eye Near:   Left Eye Near:    Bilateral Near:     Physical Exam Vitals and nursing note reviewed.  Constitutional:      Appearance: He is not ill-appearing or toxic-appearing.  HENT:     Head: Normocephalic  and atraumatic.     Right Ear: Hearing and external ear normal.     Left Ear: Hearing and external ear normal.     Nose: Nose normal.     Mouth/Throat:     Lips: Pink.     Mouth: Mucous membranes are moist. No injury.     Tongue: No lesions. Tongue does not deviate from midline.     Palate: No mass and lesions.     Pharynx: Oropharynx is clear. Uvula midline. No pharyngeal swelling, oropharyngeal exudate, posterior oropharyngeal erythema or uvula swelling.     Tonsils: No tonsillar exudate or tonsillar abscesses.  Eyes:     General: Lids are normal. Vision grossly intact. Gaze aligned appropriately.     Extraocular Movements: Extraocular movements intact.     Conjunctiva/sclera: Conjunctivae normal.  Cardiovascular:     Rate and Rhythm: Normal rate and regular rhythm.     Heart sounds: Normal heart sounds, S1 normal and S2 normal.  Pulmonary:     Effort: Pulmonary effort is normal. No respiratory distress.     Breath sounds: Normal breath sounds and air entry.  Abdominal:     General: Bowel sounds are normal.     Palpations: Abdomen is soft.     Tenderness: There is abdominal tenderness (Suprapubic, otherwise nontender to the abdomen.  No peritoneal signs.). There is no right CVA tenderness, left CVA tenderness or guarding.  Musculoskeletal:     Cervical back: Neck supple.  Skin:    General: Skin is warm and dry.     Capillary Refill: Capillary refill takes less than 2 seconds.     Findings: No rash.  Neurological:     General: No focal deficit present.     Mental Status: He is alert and oriented to person, place, and time. Mental status is at baseline.     Cranial Nerves: No dysarthria or facial asymmetry.  Psychiatric:        Mood and Affect: Mood normal.        Speech: Speech normal.        Behavior: Behavior normal.        Thought Content: Thought content normal.        Judgment: Judgment normal.      UC Treatments / Results  Labs (all labs ordered are listed, but  only abnormal results are displayed) Labs Reviewed  POCT URINALYSIS DIP (MANUAL ENTRY) - Abnormal; Notable for the following  components:      Result Value   Color, UA other (*)    Blood, UA large (*)    Protein Ur, POC =30 (*)    All other components within normal limits  URINE CULTURE  CBC  BASIC METABOLIC PANEL  CYTOLOGY, (ORAL, ANAL, URETHRAL) ANCILLARY ONLY    EKG   Radiology No results found.  Procedures Procedures (including critical care time)  Medications Ordered in UC Medications - No data to display  Initial Impression / Assessment and Plan / UC Course  I have reviewed the triage vital signs and the nursing notes.  Pertinent labs & imaging results that were available during my care of the patient were reviewed by me and considered in my medical decision making (see chart for details).   1.  Dysuria, gross hematuria, excessive drinking of alcohol Urinalysis shows significant hematuria and proteinuria.  Findings concerning for possible ureteral stone versus acute dehydration secondary to recent excessive alcohol intake.  No signs of alcohol withdrawal.  Advised to stop drinking alcohol and drink plenty of water to stay well-hydrated.  Advised to avoid urinary irritants.  Urine culture is pending to assess for bacteriuria given presentation and symptoms. Cytology is pending, will call patient if results are abnormal in the next 2 to 3 days and treat as indicated. CBC and BMP pending to evaluate for electrolyte imbalance, infection, and renal function/AKI, staff will call patient if abnormal.  Encourage follow-up with PCP in the next 3 to 4 days to discuss plan for better coping mechanism to handle stress and further symptoms.  Counseled patient on potential for adverse effects with medications prescribed/recommended today, strict ER and return-to-clinic precautions discussed, patient verbalized understanding.    Final Clinical Impressions(s) / UC Diagnoses   Final  diagnoses:  Dysuria  Gross hematuria  Excessive drinking of alcohol     Discharge Instructions      Your symptoms may be related to a kidney stone. Take tamsulosin once daily, this will help you pass the stone if there is a kidney stone.  Continue drinking plenty of water.  Avoid drinking sodas, sweet tea, and caffeine.  \drinking alcohol.  This will further dehydrate you and is bad for your kidneys.  I would really like for you to schedule an appointment with your primary care provider to discuss healthy coping mechanisms.   Staff will call you if any of your blood work is abnormal.  If you develop any new or worsening symptoms or if your symptoms do not start to improve, please return here or follow-up with your primary care provider. If your symptoms are severe, please go to the emergency room.     ED Prescriptions     Medication Sig Dispense Auth. Provider   tamsulosin (FLOMAX) 0.4 MG CAPS capsule Take 1 capsule (0.4 mg total) by mouth daily after supper. 30 capsule Carlisle Beers, FNP      PDMP not reviewed this encounter.   Carlisle Beers, [place] 12/16/22 1858

## 2022-12-16 NOTE — Discharge Instructions (Addendum)
Your symptoms may be related to a kidney stone. Take tamsulosin once daily, this will help you pass the stone if there is a kidney stone.  Continue drinking plenty of water.  Avoid drinking sodas, sweet tea, and caffeine.  \ Stop drinking alcohol.  This will further dehydrate you and is bad for your kidneys.  I would really like for you to schedule an appointment with your primary care provider to discuss healthy coping mechanisms.   Staff will call you if any of your blood work is abnormal.  If you develop any new or worsening symptoms or if your symptoms do not start to improve, please return here or follow-up with your primary care provider. If your symptoms are severe, please go to the emergency room.

## 2022-12-16 NOTE — ED Triage Notes (Signed)
Pt c/o hematuria, dysuria started yesterday-pelvic pain x today-NAD-steady gait

## 2022-12-25 ENCOUNTER — Other Ambulatory Visit: Payer: Self-pay | Admitting: Family Medicine

## 2022-12-25 DIAGNOSIS — R310 Gross hematuria: Secondary | ICD-10-CM

## 2023-01-01 ENCOUNTER — Ambulatory Visit
Admission: RE | Admit: 2023-01-01 | Discharge: 2023-01-01 | Disposition: A | Discharge status: Home / Self Care | Payer: No Typology Code available for payment source | Source: Ambulatory Visit | Attending: Family Medicine | Admitting: Family Medicine

## 2023-01-01 ENCOUNTER — Other Ambulatory Visit: Payer: No Typology Code available for payment source

## 2023-01-01 DIAGNOSIS — R310 Gross hematuria: Secondary | ICD-10-CM

## 2023-01-01 MED ORDER — IOPAMIDOL (ISOVUE-300) INJECTION 61%
125.0000 mL | Freq: Once | INTRAVENOUS | Status: AC | PRN
  Administered 2023-01-01: 125 mL via INTRAVENOUS

## 2023-01-05 ENCOUNTER — Other Ambulatory Visit: Payer: Self-pay

## 2023-07-28 ENCOUNTER — Other Ambulatory Visit: Payer: Self-pay

## 2023-07-28 ENCOUNTER — Emergency Department (HOSPITAL_COMMUNITY)

## 2023-07-28 ENCOUNTER — Emergency Department (HOSPITAL_COMMUNITY)
Admission: EM | Admit: 2023-07-28 | Discharge: 2023-07-28 | Disposition: A | Discharge status: Home / Self Care | Attending: Emergency Medicine | Admitting: Emergency Medicine

## 2023-07-28 DIAGNOSIS — S2222XA Fracture of body of sternum, initial encounter for closed fracture: Secondary | ICD-10-CM | POA: Diagnosis not present

## 2023-07-28 DIAGNOSIS — R1012 Left upper quadrant pain: Secondary | ICD-10-CM | POA: Insufficient documentation

## 2023-07-28 DIAGNOSIS — Y92410 Unspecified street and highway as the place of occurrence of the external cause: Secondary | ICD-10-CM | POA: Diagnosis not present

## 2023-07-28 DIAGNOSIS — R079 Chest pain, unspecified: Secondary | ICD-10-CM | POA: Diagnosis present

## 2023-07-28 LAB — CBC WITH DIFFERENTIAL/PLATELET
Abs Immature Granulocytes: 0.03 10*3/uL (ref 0.00–0.07)
Basophils Absolute: 0 10*3/uL (ref 0.0–0.1)
Basophils Relative: 1 %
Eosinophils Absolute: 0.1 10*3/uL (ref 0.0–0.5)
Eosinophils Relative: 2 %
HCT: 35 % — ABNORMAL LOW (ref 39.0–52.0)
Hemoglobin: 11.6 g/dL — ABNORMAL LOW (ref 13.0–17.0)
Immature Granulocytes: 1 %
Lymphocytes Relative: 21 %
Lymphs Abs: 1.1 10*3/uL (ref 0.7–4.0)
MCH: 31.4 pg (ref 26.0–34.0)
MCHC: 33.1 g/dL (ref 30.0–36.0)
MCV: 94.6 fL (ref 80.0–100.0)
Monocytes Absolute: 0.6 10*3/uL (ref 0.1–1.0)
Monocytes Relative: 11 %
Neutro Abs: 3.1 10*3/uL (ref 1.7–7.7)
Neutrophils Relative %: 64 %
Platelets: 270 10*3/uL (ref 150–400)
RBC: 3.7 MIL/uL — ABNORMAL LOW (ref 4.22–5.81)
RDW: 12.3 % (ref 11.5–15.5)
WBC: 4.9 10*3/uL (ref 4.0–10.5)
nRBC: 0 % (ref 0.0–0.2)

## 2023-07-28 LAB — COMPREHENSIVE METABOLIC PANEL
ALT: 19 U/L (ref 0–44)
AST: 23 U/L (ref 15–41)
Albumin: 3.9 g/dL (ref 3.5–5.0)
Alkaline Phosphatase: 85 U/L (ref 38–126)
Anion gap: 8 (ref 5–15)
BUN: 18 mg/dL (ref 6–20)
CO2: 26 mmol/L (ref 22–32)
Calcium: 8.8 mg/dL — ABNORMAL LOW (ref 8.9–10.3)
Chloride: 101 mmol/L (ref 98–111)
Creatinine, Ser: 0.84 mg/dL (ref 0.61–1.24)
GFR, Estimated: >60 mL/min (ref >60)
Glucose, Bld: 99 mg/dL (ref 70–99)
Potassium: 4.2 mmol/L (ref 3.5–5.1)
Sodium: 135 mmol/L (ref 135–145)
Total Bilirubin: 1.2 mg/dL (ref 0.0–1.2)
Total Protein: 7 g/dL (ref 6.5–8.1)

## 2023-07-28 MED ORDER — IOHEXOL 300 MG/ML  SOLN
100.0000 mL | Freq: Once | INTRAMUSCULAR | Status: AC | PRN
  Administered 2023-07-28: 100 mL via INTRAVENOUS

## 2023-07-28 MED ORDER — IBUPROFEN 600 MG PO TABS: 600.0000 mg | ORAL_TABLET | Freq: Four times a day (QID) | ORAL | 0 refills | Status: AC | PRN

## 2023-07-28 MED ORDER — SODIUM CHLORIDE (PF) 0.9 % IJ SOLN
INTRAMUSCULAR | Status: AC
  Filled 2023-07-28: qty 50
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
## 2023-07-28 NOTE — Discharge Instructions (Signed)
 You can take the ibuprofen that was sent to your pharmacy as needed for pain.  Avoid any heavy lifting for at least another week and a half while your sternum heals.
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
